# Patient Record
Sex: Female | Born: 1995 | Smoking: Never smoker
Health system: Southern US, Community
[De-identification: ages and names within clinical notes are randomized; demographics above are authoritative.]

## PROBLEM LIST (undated history)

## (undated) DIAGNOSIS — F603 Borderline personality disorder: Secondary | ICD-10-CM

## (undated) DIAGNOSIS — F3181 Bipolar II disorder: Secondary | ICD-10-CM

## (undated) DIAGNOSIS — J45909 Unspecified asthma, uncomplicated: Secondary | ICD-10-CM

## (undated) HISTORY — PX: EYE SURGERY: SHX253

---

## 2015-01-04 ENCOUNTER — Emergency Department (HOSPITAL_COMMUNITY)
Admission: EM | Admit: 2015-01-04 | Discharge: 2015-01-04 | Disposition: A | Discharge status: Home / Self Care | Payer: Self-pay | Attending: Emergency Medicine | Admitting: Emergency Medicine

## 2015-01-04 ENCOUNTER — Encounter (HOSPITAL_COMMUNITY): Payer: Self-pay

## 2015-01-04 DIAGNOSIS — Z8659 Personal history of other mental and behavioral disorders: Secondary | ICD-10-CM | POA: Insufficient documentation

## 2015-01-04 DIAGNOSIS — IMO0001 Reserved for inherently not codable concepts without codable children: Secondary | ICD-10-CM

## 2015-01-04 DIAGNOSIS — J45909 Unspecified asthma, uncomplicated: Secondary | ICD-10-CM | POA: Insufficient documentation

## 2015-01-04 DIAGNOSIS — K219 Gastro-esophageal reflux disease without esophagitis: Secondary | ICD-10-CM | POA: Insufficient documentation

## 2015-01-04 HISTORY — DX: Bipolar II disorder: F31.81

## 2015-01-04 HISTORY — DX: Borderline personality disorder: F60.3

## 2015-01-04 HISTORY — DX: Unspecified asthma, uncomplicated: J45.909

## 2015-01-04 MED ORDER — PANTOPRAZOLE SODIUM 40 MG PO TBEC: 40.0000 mg | DELAYED_RELEASE_TABLET | Freq: Every day | ORAL | Status: AC

## 2015-01-04 NOTE — ED Notes (Signed)
Per EMS, pt. had chest pain  upon inspiration today at 1245. Pt. States that this had happened intermittently for the past 2-3 years The pain was relieved by standing. Hx of Asthma, Bipolar, and borderline personality.

## 2015-01-04 NOTE — Discharge Instructions (Signed)
Medications: Protonix, usual home medications When you have another episode of reflux pain, consider what you have eaten recently. Try to notice any trends or triggers for these symptoms.  Follow Up: Please followup with your primary doctor within 5 days for discussion of your diagnoses and further evaluation after today's visit; if you do not have a primary care doctor use the resource guide provided to find one; Please return to the ER for new or worsening symptoms or additional concerns.   Gastroesophageal Reflux Disease, Adult Normally, food travels down the esophagus and stays in the stomach to be digested. If a person has gastroesophageal reflux disease (GERD), food and stomach acid move back up into the esophagus. When this happens, the esophagus becomes sore and swollen (inflamed). Over time, GERD can make small holes (ulcers) in the lining of the esophagus. HOME CARE Diet  Follow a diet as told by your doctor. You may need to avoid foods and drinks such as:  Coffee and tea (with or without caffeine).  Drinks that contain alcohol.  Energy drinks and sports drinks.  Carbonated drinks or sodas.  Chocolate and cocoa.  Peppermint and mint flavorings.  Garlic and onions.  Horseradish.  Spicy and acidic foods, such as peppers, chili powder, curry powder, vinegar, hot sauces, and BBQ sauce.  Citrus fruit juices and citrus fruits, such as oranges, lemons, and limes.  Tomato-based foods, such as red sauce, chili, salsa, and pizza with red sauce.  Fried and fatty foods, such as donuts, french fries, potato chips, and high-fat dressings.  High-fat meats, such as hot dogs, rib eye steak, sausage, ham, and bacon.  High-fat dairy items, such as whole milk, butter, and cream cheese.  Eat small meals often. Avoid eating large meals.  Avoid drinking large amounts of liquid with your meals.  Avoid eating meals during the 2-3 hours before bedtime.  Avoid lying down right after you  eat.  Do not exercise right after you eat. General Instructions  Pay attention to any changes in your symptoms.  Take over-the-counter and prescription medicines only as told by your doctor. Do not take aspirin, ibuprofen, or other NSAIDs unless your doctor says it is okay.  Do not use any tobacco products, including cigarettes, chewing tobacco, and e-cigarettes. If you need help quitting, ask your doctor.  Wear loose clothes. Do not wear anything tight around your waist.  Raise (elevate) the head of your bed about 6 inches (15 cm).  Try to lower your stress. If you need help doing this, ask your doctor.  If you are overweight, lose an amount of weight that is healthy for you. Ask your doctor about a safe weight loss goal.  Keep all follow-up visits as told by your doctor. This is important. GET HELP IF:  You have new symptoms.  You lose weight and you do not know why it is happening.  You have trouble swallowing, or it hurts to swallow.  You have wheezing or a cough that keeps happening.  Your symptoms do not get better with treatment.  You have a hoarse voice. GET HELP RIGHT AWAY IF:  You have pain in your arms, neck, jaw, teeth, or back.  You feel sweaty, dizzy, or light-headed.  You have chest pain or shortness of breath.  You throw up (vomit) and your throw up looks like blood or coffee grounds.  You pass out (faint).  Your poop (stool) is bloody or black.  You cannot swallow, drink, or eat.   This  information is not intended to replace advice given to you by your health care provider. Make sure you discuss any questions you have with your health care provider.   Document Released: 08/07/2007 Document Revised: 11/09/2014 Document Reviewed: 06/15/2014 Elsevier Interactive Patient Education 2016 [employer]   Emergency Department Resource Guide 1) Find a Doctor and Pay Out of Pocket Although you won't have to find out who is covered by your insurance  plan, it is a good idea to ask around and get recommendations. You will then need to call the office and see if the doctor you have chosen will accept you as a new patient and what types of options they offer for patients who are self-pay. Some doctors offer discounts or will set up payment plans for their patients who do not have insurance, but you will need to ask so you aren't surprised when you get to your appointment.  2) Contact Your Local Health Department Not all health departments have doctors that can see patients for sick visits, but many do, so it is worth a call to see if yours does. If you don't know where your local health department is, you can check in your phone book. The CDC also has a tool to help you locate your state's health department, and many state websites also have listings of all of their local health departments.  3) Find a Walk-in Clinic If your illness is not likely to be very severe or complicated, you may want to try a walk in clinic. These are popping up all over the country in pharmacies, drugstores, and shopping centers. They're usually staffed by nurse practitioners or physician assistants that have been trained to treat common illnesses and complaints. They're usually fairly quick and inexpensive. However, if you have serious medical issues or chronic medical problems, these are probably not your best option.  No Primary Care Doctor: - Call Health Connect at  [phone] - they can help you locate a primary care doctor that  accepts your insurance, provides certain services, etc. - Physician Referral Service- [phone]  Chronic Pain Problems: Organization         Address  Phone   Notes  [facility]  [phone] Patients need to be referred by their primary care doctor.   Medication Assistance: Organization         Address  Phone   Notes  [facility] [address] [place], [place] [address]  [phone] --Must be a resident of [place] -- Must have NO insurance coverage whatsoever (no Medicaid/ Medicare, etc.) -- The pt. MUST have a primary care doctor that directs their care regularly and follows them in the community   [facility]ist  [phone]   [employer]  [phone]    Agencies that provide inexpensive medical care: Organization         Address  Phone   Notes  [facility]  [phone]   [facility]    [phone]   [facility] [address] [place], [place] [address] [phone]   [facility] [address], [place] [phone]   [facility]anned [facility]    [phone]   [facility]    [phone]   [facility]  [address], [place] Phone:  [phone], Fax:  [phone] Hours of Operation:  9 am - 6  pm, M-F.  Also accepts Medicaid/Medicare and self-pay.  [facility]  [address], [place] Phone: [phone], Fax: [phone]. Hours of Operation:  8:30 am - 5:30 pm, M-F.  Also accepts Medicaid and self-pay.  [facility] [place] [address] [place] Phone: [phone]   [facility] [address] Natasha Bence [place], [place] [phone], Ext. [phone] Mondays & Thursdays: 7-9 AM.  First 15 patients are seen on a first come, first serve basis.    Medicaid-accepting [facility] Providers:  Organization         Address  Phone   Notes  [facility] [address], [place] [phone] Also accepts self-pay patients.  [facility] [address] Laurell Josephs [place], [place]  [phone]   [facility] [address], [place] [phone]   [facility] [address] [place] [phone]   Renaye Rakers [address], [place]   [phone] Only accepts [place] patients after they have their name applied to their card.   Self-Pay (no insurance) in [facility]:  Organization         Address  Phone   Notes  Sickle Cell Patients, [facility] [address] [place], [place] [phone]   [facility] [address] [place], [place] [phone]   [facility] [place]  [address], [place] [phone]   [facility]/Dr. Osei-Bonsu  [address] [place] or [address], [place] [phone] Phone number for both [place] and [place] locations is the same.  [facility] [address], [place] [phone]   [facility] [address] [place] or [address] [phone]   [facility] [address], [place] [phone], phone; [phone], fax Sees patients 1st and 3rd Saturday of every month.  Must not qualify for public or private insurance (i.e. Medicaid, Medicare, [place] Health Choice, Veterans' Benefits)  Household income should be no more than 200% of the poverty level The clinic cannot treat you if you are pregnant or think you are pregnant  Sexually transmitted diseases are not treated at the clinic.    Dental Care: Organization         Address  Phone  Notes  [facility] Department of [facility] [address] [place], [place] [phone] Accepts children up to age 66 who are enrolled in [place] or [place] Health Choice; pregnant women with a Medicaid card; and children who have applied for Medicaid or [place] Health Choice, but were declined, whose parents can pay a reduced fee at time of service.  [facility] Department of [facility]  [address], [place] [phone] Accepts children up to age 24 who are enrolled in [place] or [place] Health  Choice; pregnant women with a Medicaid card; and children who have applied for Medicaid or [place] Health Choice, but were declined, whose parents can pay a reduced fee at time of service.  [facility]  [address] [place], [place] [phone] Patients are seen by appointment only. Walk-ins are not accepted. [facility] will see patients 46 years of age and older.  Monday - Tuesday (8am-5pm) Most Wednesdays (8:30-5pm) $30 per visit, cash only  [facility] Adult Dental Access PROGRAM  [address], [facility] [phone] Patients are seen by appointment only. Walk-ins are not accepted. [facility] will see patients 61 years of age and older. One Wednesday Evening (Monthly: Volunteer Based).  $30 per visit, cash only  [employer]  [phone] for adults; Children under age 52, call Graduate Pediatric Dentistry at [phone]. Children aged 10-14, please call [phone] to request a pediatric application.  Dental services are provided in all areas of dental care including fillings, crowns and bridges, complete and partial dentures, implants, gum treatment, root canals, and extractions. Preventive care is also provided. Treatment is provided to both adults and children. Patients are selected via a lottery and there is often a waiting list.   [facility] [address], [place]  [phone] [email or web address]   [facility] [address] [place], [place] [phone], Ext. [phone] Second and Fourth Thursday of each month, opens at 6:30 AM; Clinic ends at 9 AM.  Patients are seen on a first-come first-served basis, and a limited number are seen during each clinic.   [facility]  [address] Ether Griffins [place], [place] [phone]   Eligibility Requirements You must have lived in [place], [place], or [place] counties for at least the last three months.   You cannot be eligible for state or  federal sponsored [place], including [employer], [place], or [employer].   You generally cannot be eligible for healthcare insurance through your employer.    How to apply: Eligibility screenings are held every Tuesday and Wednesday afternoon from 1:00 pm until 4:00 pm. You do not need an appointment for the interview!  [facility] [address], [place], [place] [phone]   [facility] Health Department  [phone]   [facility]  [phone]   [facility] Health Department  [phone]    Behavioral Health Resources in the Community: Intensive Outpatient Programs Organization         Address  Phone  Notes  [facility] [address] [place], [place] [phone]   [facility] [address], [place], [place] [phone]   [facility] [address], [place], [place]  [phone]   [facility] Health [address]  [place], [place] [phone] or [phone]   Substance Abuse Resources Organization         Address  Phone  Notes  [facility] Drug Services  [phone]   [facility]  [phone]   [facility] [place]  [phone]   Floydene Flock  [phone]   Residential & Outpatient Substance Abuse Program  [phone]   Psychological Services Organization         Address  Phone  Notes  [facility] Health  [phone]   [facility] Services  [phone]   [facility] Mental Health [address] [place] [phone] or [phone]    Mobile Crisis Teams Organization         Address  Phone  Notes  [facility], Mobile Crisis Care Unit  [phone]   [facility]  [address] [place], [place] [phone]   Doristine Locks [address] [place] [phone]    Self-Help/Support Groups Organization         Address  Phone  Notes  [facility] [place] - variety of support groups  [phone]- I7437963 Call for more information  Narcotics Anonymous (NA), [facility] [address], [employer] [place]  2 meetings at this location   Statistician         Address  Phone  Notes  [facility] [address],    [place]  [phone]   [facility]  [address], [place] [address], [place], [place] [phone]   [facility] [address] [place], [place] [phone] Admissions: 8am-3pm M-F  [facility] [address]    [place], [place] [phone]   [facility] [address] [place], [place], [place] [phone]   [facility] [address]  [place], [place] [phone]   [facility] - Intensive Outpatient [address] Laurell Josephs [address], [place], [place] [phone]   [facility] ([facility]iction [facility] [address]  [place], [place] [phone] or [phone]   [facility] ([facility]) [address], [place], [place] [phone] Accepts Medicaid  [place] [address]  [place] [phone] Substance Abuse/Addiction Treatment   [facility] Organization         Address  Phone  Notes  [facility]  [phone]   Angie Fava, PhD [address] Ervin Knack [place], [place]   [phone] or [phone]   [facility]   [address] [place], [place] [phone]   [facility] [address], [place], [place] [phone] Insurance/Medicaid/sponsorship through [facility] and Families [address]                                    [place], [place] [phone] Therapy/tele-psych/case  [facility] [address], [place] [phone]    Dr. Lolly Mustache  [phone]   [facility]  [employer] [facility]  Dept. 1) [address] [place] 2) [address] [place] 3)  [address], [place] [phone]  [phone]   [facility] Child Abuse Hotline 9365206094 or [phone] (After Hours)

## 2015-01-04 NOTE — ED Provider Notes (Signed)
CSN: [id number]     Arrival date & time 01/04/15  1353 History   First MD Initiated Contact with Patient 01/04/15 1406     Chief Complaint  Patient presents with  . Chest Pain     (Consider location/radiation/quality/duration/timing/severity/associated sxs/prior Treatment) Patient is a 19 y.o. female presenting with chest pain. The history is provided by the patient and medical records. No language interpreter was used.  Chest Pain Associated symptoms: no abdominal pain, no back pain, no cough, no dizziness, no headache, no nausea, no shortness of breath, not vomiting and no weakness    Stann Mainlandylah Defreitas is a 19 y.o. female  with a PMH of anxiety, bipolar, asthma presents to the Emergency Department complaining of intermittent central chest pain x 2-3 years. Episodes last anywhere between 2 minutes and 30 minutes, which this episode lasting 30 minutes. It has now resolved and patient has no complaints at this time. No associated symptoms. No radiation of the pain. Worse with deep inspirations, no alleviating factors noted.   Past Medical History  Diagnosis Date  . Asthma   . Bipolar 2 disorder (HCC)   . Borderline personality disorder    Past Surgical History  Procedure Laterality Date  . Eye surgery     No family history on file. Social History  Substance Use Topics  . Smoking status: Never Smoker   . Smokeless tobacco: Not on file  . Alcohol Use: Not on file   OB History    No data available     Review of Systems  Constitutional: Negative.   HENT: Negative for congestion, rhinorrhea and sore throat.   Eyes: Negative for visual disturbance.  Respiratory: Negative for cough, shortness of breath and wheezing.   Cardiovascular: Positive for chest pain.  Gastrointestinal: Negative for nausea, vomiting, abdominal pain, diarrhea and constipation.  Endocrine: Negative for polydipsia and polyuria.  Musculoskeletal: Negative for myalgias, back pain, arthralgias and neck pain.  Skin:  Negative for rash.  Neurological: Negative for dizziness, weakness and headaches.      Allergies  Review of patient's allergies indicates not on file.  Home Medications   Prior to Admission medications   Medication Sig Start Date End Date Taking? Authorizing Provider  pantoprazole (PROTONIX) 40 MG tablet Take 1 tablet (40 mg total) by mouth daily. 01/04/15   Joe Gee Pilcher Marlea Gambill, PA-C   BP 115/63 mmHg  Pulse 84  Temp(Src) 99 F (37.2 C)  Resp 16  Ht 5\' 5"  (1.651 m)  Wt 160 lb (72.576 kg)  BMI 26.63 kg/m2  SpO2 99%  LMP 12/14/2014 Physical Exam  Constitutional: She is oriented to person, place, and time. She appears well-developed and well-nourished.  Alert and in no acute distress  HENT:  Head: Normocephalic and atraumatic.  Cardiovascular: Normal rate, regular rhythm, normal heart sounds and intact distal pulses.  Exam reveals no gallop and no friction rub.   No murmur heard. Pulmonary/Chest: Effort normal and breath sounds normal. No respiratory distress. She has no wheezes. She has no rales. She exhibits no tenderness.  Abdominal: She exhibits no mass. There is no rebound and no guarding.  Abdomen soft, non-distended Bowel sounds positive in all four quadrants  Tender to palpation of epigastrium    Musculoskeletal: She exhibits no edema.  Neurological: She is alert and oriented to person, place, and time.  Skin: Skin is warm and dry. No rash noted.  Psychiatric: She has a normal mood and affect. Her behavior is normal. Judgment and thought content normal.  Nursing note and vitals reviewed.   ED Course  Procedures (including critical care time) Labs Review Labs Reviewed - No data to display  Imaging Review No results found. I have personally reviewed and evaluated these images and lab results as part of my medical decision-making.   EKG Interpretation None      MDM   Final diagnoses:  Reflux   Marjarie Irion presents for chest pain x 30 minutes which is  now resolved. She has a normal cardiopulmonary exam, EKG is normal, no significant cardiac risk factors with Heart score of 0. Cardiac/pulmonary etiology unlikely.  Tenderness to palpation of epigastric, along with her history of eating a large amount of Halloween candy over the last 2 days, suggest GERD. Other differentials include anxiety - she states she has been diagnosed with this in the past. Does not state an inciting event or feeling anxious, so not quite as likely, but should be considered. Informed patient to follow up with PCP in the next 5 days to discuss diagnosis with them. She expressed verbal understanding of plan and agreed.      [facility] Ocia Simek, PA-C 01/04/15 1442  Arby Barrette, MD 01/04/15 [phone]

## 2015-05-22 ENCOUNTER — Emergency Department (HOSPITAL_COMMUNITY)
Admission: EM | Admit: 2015-05-22 | Discharge: 2015-05-23 | Disposition: A | Discharge status: Home / Self Care | Payer: BLUE CROSS/BLUE SHIELD | Attending: Emergency Medicine | Admitting: Emergency Medicine

## 2015-05-22 ENCOUNTER — Encounter (HOSPITAL_COMMUNITY): Payer: Self-pay | Admitting: *Deleted

## 2015-05-22 DIAGNOSIS — F603 Borderline personality disorder: Secondary | ICD-10-CM | POA: Insufficient documentation

## 2015-05-22 DIAGNOSIS — Z3202 Encounter for pregnancy test, result negative: Secondary | ICD-10-CM | POA: Insufficient documentation

## 2015-05-22 DIAGNOSIS — J45909 Unspecified asthma, uncomplicated: Secondary | ICD-10-CM | POA: Diagnosis not present

## 2015-05-22 DIAGNOSIS — F3181 Bipolar II disorder: Secondary | ICD-10-CM | POA: Insufficient documentation

## 2015-05-22 DIAGNOSIS — T43594A Poisoning by other antipsychotics and neuroleptics, undetermined, initial encounter: Secondary | ICD-10-CM | POA: Insufficient documentation

## 2015-05-22 DIAGNOSIS — Z79899 Other long term (current) drug therapy: Secondary | ICD-10-CM | POA: Insufficient documentation

## 2015-05-22 DIAGNOSIS — Y9289 Other specified places as the place of occurrence of the external cause: Secondary | ICD-10-CM | POA: Diagnosis not present

## 2015-05-22 DIAGNOSIS — Y9389 Activity, other specified: Secondary | ICD-10-CM | POA: Insufficient documentation

## 2015-05-22 DIAGNOSIS — Y998 Other external cause status: Secondary | ICD-10-CM | POA: Insufficient documentation

## 2015-05-22 DIAGNOSIS — T50904A Poisoning by unspecified drugs, medicaments and biological substances, undetermined, initial encounter: Secondary | ICD-10-CM

## 2015-05-22 LAB — COMPREHENSIVE METABOLIC PANEL
ALK PHOS: 61 U/L (ref 38–126)
ALT: 16 U/L (ref 14–54)
AST: 24 U/L (ref 15–41)
Albumin: 5.1 g/dL — ABNORMAL HIGH (ref 3.5–5.0)
Anion gap: 9 (ref 5–15)
BILIRUBIN TOTAL: 0.8 mg/dL (ref 0.3–1.2)
BUN: 14 mg/dL (ref 6–20)
CALCIUM: 9.9 mg/dL (ref 8.9–10.3)
CHLORIDE: 108 mmol/L (ref 101–111)
CO2: 24 mmol/L (ref 22–32)
CREATININE: 0.84 mg/dL (ref 0.44–1.00)
Glucose, Bld: 109 mg/dL — ABNORMAL HIGH (ref 65–99)
Potassium: 3.4 mmol/L — ABNORMAL LOW (ref 3.5–5.1)
Sodium: 141 mmol/L (ref 135–145)
Total Protein: 9.1 g/dL — ABNORMAL HIGH (ref 6.5–8.1)

## 2015-05-22 LAB — RAPID URINE DRUG SCREEN, HOSP PERFORMED
AMPHETAMINES: NOT DETECTED
Barbiturates: NOT DETECTED
Benzodiazepines: NOT DETECTED
Cocaine: NOT DETECTED
OPIATES: NOT DETECTED
TETRAHYDROCANNABINOL: NOT DETECTED

## 2015-05-22 LAB — CBC
HCT: 38.1 % (ref 36.0–46.0)
Hemoglobin: 13 g/dL (ref 12.0–15.0)
MCH: 25.5 pg — AB (ref 26.0–34.0)
MCHC: 34.1 g/dL (ref 30.0–36.0)
MCV: 74.9 fL — ABNORMAL LOW (ref 78.0–100.0)
PLATELETS: 370 10*3/uL (ref 150–400)
RBC: 5.09 MIL/uL (ref 3.87–5.11)
RDW: 13.7 % (ref 11.5–15.5)
WBC: 6.2 10*3/uL (ref 4.0–10.5)

## 2015-05-22 LAB — I-STAT BETA HCG BLOOD, ED (MC, WL, AP ONLY)

## 2015-05-22 LAB — ETHANOL

## 2015-05-22 LAB — CBG MONITORING, ED: GLUCOSE-CAPILLARY: 88 mg/dL (ref 65–99)

## 2015-05-22 LAB — SALICYLATE LEVEL

## 2015-05-22 LAB — ACETAMINOPHEN LEVEL: Acetaminophen (Tylenol), Serum: <10 ug/mL — ABNORMAL LOW (ref 10–30)

## 2015-05-22 NOTE — ED Notes (Addendum)
Pt states that she was feeling anxious and nevous and wanted to calm down; pt states that she took 120mg  Buspar at 1906; pt c/o feeling nauseous and drowsy; pt states that she vomited x 1; pt denies SI; pt states "I was just trying to calm down"; pt states that she is supposed to take 15mg  of Buspar 3 times a day.

## 2015-05-22 NOTE — ED Notes (Signed)
Spoke with Poison Control - Janice Hernandez and they advised that this is a significant overdose that took some effort; watch for bradycardia, hypotension, and drowsiness; seizures is also a concern, place patient on seizure precautions and treat with benzo's; watch patient 4 hrs post ingestion and recommend pysch eval due to significant overdose

## 2015-05-22 NOTE — ED Notes (Signed)
Nurse Tech drawing labs

## 2015-05-23 NOTE — ED Notes (Signed)
Poison controlled signed off on patient.

## 2015-05-23 NOTE — ED Notes (Signed)
Took patient some water to drink. She has already been drinking her sprite

## 2015-05-23 NOTE — Discharge Instructions (Signed)
Please utilize the resources provided by our counselor. Please utilize resources available at your college.  Return to the ER if there is any thoughts of hurting yourself.

## 2015-05-23 NOTE — BH Assessment (Addendum)
Tele Assessment Note   Janice Hernandez is an 20 y.o. female.  -Clinician was asked by Dr. Luretha Rued to see patient.  Patient took too many of her buspar in an attempt to quell her anxiety.  Patient is accompanied by a roommate and patient's mother.  Patient admits to taking 7 of her buspar in an attempt to calm herself.  Patient said that she has been stressed out about school work and a problem with a roommate.  Patient lives on campus at [place].  Patient says that she has never tried to kill herself and denies those thoughts tonight.  Patient said that she has no HI.  There are times she has seen angels and demons but this is not one of those times.  Mother said that patient has been diagnosed with Bipolar II d/o and Borderline Personality d/o.  Patient denies any current SA issues.  Patient says that she has problems with depression but she does take her medications as directed.  Patient is followed by psychiatrist with the [facility] in [place].  Patient says she looked into the counseling service at [facility] and that she was told they could only see her once monthly.  Patient says she wants to find a provider for counseling that can see her more often.  -Clinician had been told by Dr. Vassie Moselle that patient could go home.  Patient was given referral information and said that she would follow up during the day today.  Patient was discharged home w/ mother and roommate.  Diagnosis: Bipolar 2 d/o; BPD  Past Medical History:  Past Medical History  Diagnosis Date  . Asthma   . Bipolar 2 disorder (HCC)   . Borderline personality disorder     Past Surgical History  Procedure Laterality Date  . Eye surgery      Family History: No family history on file.  Social History:  reports that she has never smoked. She does not have any smokeless tobacco history on file. She reports that she does not drink alcohol or use illicit drugs.  Additional Social History:  Alcohol / Drug Use Pain  Medications: None Prescriptions: Buspar, Lamictal, Latuda, Hydroxine, Omprazoline Over the Counter: None History of alcohol / drug use?: No history of alcohol / drug abuse  CIWA: CIWA-Ar BP: 116/73 mmHg Pulse Rate: 91 COWS:    PATIENT STRENGTHS: (choose at least two) Ability for insight Average or above average intelligence Communication skills Supportive family/friends  Allergies: No Known Allergies  Home Medications:  (Not in a hospital admission)  OB/GYN Status:  Patient's last menstrual period was 05/01/2015.  General Assessment Data [facility] of Assessment: [facility] TTS Assessment: In system Is this a Tele or Face-to-Face Assessment?: Face-to-Face Is this an Initial Assessment or a Re-assessment for this encounter?: Initial Assessment Marital status: Single Is patient pregnant?: No Pregnancy Status: No Living Arrangements: Other (Comment) ([facility]) Can pt return to current living arrangement?: Yes Admission Status: Voluntary Is patient capable of signing voluntary admission?: Yes Referral Source: Self/Family/Friend Insurance type: Administrator, arts     Crisis Care Plan Living Arrangements: Other (Comment) ([facility]) Name of Psychiatrist: Psychiatrist in [facility]) Name of Therapist: None  Education Status Is patient currently in school?: Yes Current Grade: Sophomore 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 :  No Suicidal Intent: No Has patient had any suicidal intent within the past 6 months prior to admission? : No Is patient at risk for suicide?: No Suicidal Plan?: No Has patient had any suicidal plan within the past 6 months prior to admission? : No Access to Means: No What has been your use of drugs/alcohol within the last 12  months?: Pt denies Previous Attempts/Gestures: No How many times?: 0 Other Self Harm Risks: None Triggers for Past Attempts: None known Intentional Self Injurious Behavior: None Family Suicide History: No Recent stressful life event(s): Conflict (Comment), Other (Comment) (Worry over school work.  Worry about roommate) Persecutory voices/beliefs?: Yes Depression: Yes Depression Symptoms: Despondent, Loss of interest in usual pleasures, Feeling worthless/self pity, Isolating, Guilt Substance abuse history and/or treatment for substance abuse?: No Suicide prevention information given to non-admitted patients: Not applicable  Risk to Others within the past 6 months Homicidal Ideation: No Does patient have any lifetime risk of violence toward others beyond the six months prior to admission? : No Thoughts of Harm to Others: No Current Homicidal Intent: No Current Homicidal Plan: No Access to Homicidal Means: No Identified Victim: No one History of harm to others?: No Assessment of Violence: None Noted Violent Behavior Description: None noted Does patient have access to weapons?: No Criminal Charges Pending?: No Does patient have a court date: No Is patient on probation?: No  Psychosis Hallucinations: Auditory, Visual (Has sometimes seen things and heard voices) Delusions: None noted  Mental Status Report Appearance/Hygiene: Unremarkable, In hospital gown Eye Contact: Good Motor Activity: Freedom of movement, Unremarkable Speech: Logical/coherent Level of Consciousness: Alert Mood: Depressed, Anxious, Despair Affect: Anxious Anxiety Level: Severe Thought Processes: Coherent, Relevant Judgement: Unimpaired Orientation: Person, Place, Time, Situation Obsessive Compulsive Thoughts/Behaviors: Minimal  Cognitive Functioning Concentration: Decreased Memory: Recent Intact, Remote Intact IQ: Average Insight: Good Impulse Control: Fair Appetite: Good Weight Loss: 0 Weight  Gain: 0 Sleep: No Change Total Hours of Sleep: 8 Vegetative Symptoms: None  ADLScreening [facility] Assessment Services) Patient's cognitive ability adequate to safely complete daily activities?: Yes Patient able to express need for assistance with ADLs?: Yes Independently performs ADLs?: Yes (appropriate for developmental age)  Prior Inpatient Therapy Prior Inpatient Therapy: No Prior Therapy Dates: None Prior Therapy Facilty/Provider(s): None Reason for Treatment: None  Prior Outpatient Therapy Prior Outpatient Therapy: Yes Prior Therapy Dates: Current  Prior Therapy Facilty/Provider(s): [facility] in [place] Reason for Treatment: med management Does patient have an ACCT team?: No Does patient have Intensive In-House Services?  : No Does patient have [facility] services? : No Does patient have P4CC services?: No  ADL Screening (condition at time of admission) Patient's cognitive ability adequate to safely complete daily activities?: Yes Is the patient deaf or have difficulty hearing?: No Does the patient have difficulty seeing, even when wearing glasses/contacts?: No Does the patient have difficulty concentrating, remembering, or making decisions?: Yes Patient able to express need for assistance with ADLs?: Yes Does the patient have difficulty dressing or bathing?: No Independently performs ADLs?: Yes (appropriate for developmental age) Does the patient have difficulty walking or climbing stairs?: No Weakness of Legs: None Weakness of Arms/Hands: None       Abuse/Neglect Assessment (Assessment to be complete while patient is alone) Physical Abuse: Denies Verbal Abuse: Denies Sexual Abuse: Denies Exploitation of patient/patient's resources: Denies Self-Neglect: Denies     Merchant navy officer (For Healthcare) Does patient have an advance directive?: No Would patient like information on creating an advanced directive?: No - patient declined information  Additional  Information 1:1 In Past 12 Months?: No CIRT Risk: No Elopement Risk: No Does patient have medical clearance?: Yes     Disposition:  Disposition Initial Assessment Completed for this Encounter: Yes Disposition of Patient: Other dispositions Other disposition(s): Other (Comment) (Pt needs outpatient counseling.)  Alexandria LodgeHarvey, Keziah Drotar Ray 05/23/2015 3:17 AM

## 2015-05-23 NOTE — ED Provider Notes (Signed)
CSN: [id number]     Arrival date & time 05/22/15  2045 History  By signing my name below, I, Tanda RockersMargaux Venter, attest that this documentation has been prepared under the direction and in the presence of Derwood KaplanAnkit Tanara Turvey, MD. Electronically Signed: Tanda RockersMargaux Venter, ED Scribe. 05/23/2015. 12:51 AM.   Chief Complaint  Patient presents with  . Drug Overdose   The history is provided by the patient. No language interpreter was used.     HPI Comments: Janice Hernandez is a 20 y.o. female with PMHx Bipoler disorder and borderline personality disorder who presents to the Emergency Department for drug overdose that occurred last night around 7 PM (approximately 5.5 hours ago). Pt states she took 7 tablets of Buspar last night due to feeling very anxious and nervous. She reports that she wanted to calm down, prompting her to take the medications. A little while after taking the medications, pt was talking to her friend about how stressed out she was and admittied to taking the Buspar. Friend advised pt that she needed to try to vomit immediately to get the medication out of her system. Pt made herself vomit but reports it was only a small amount of emesis. She then called the behavioral health center and was advised to come to the ED for further evaluation. Pt states this was not an attempt to harm herself or get attention. Pt has never tried to harm herself in the past. She mentions that 20 minutes after taking the medication she felt nauseated and had room spinning dizziness that has since resolved. Pt currently only complains of a mild headache. Denies abdominal pain or any other associated symptoms. LNMP: 3 weeks ago.   Past Medical History  Diagnosis Date  . Asthma   . Bipolar 2 disorder (HCC)   . Borderline personality disorder    Past Surgical History  Procedure Laterality Date  . Eye surgery     No family history on file. Social History  Substance Use Topics  . Smoking status: Never Smoker   .  Smokeless tobacco: None  . Alcohol Use: No   OB History    No data available     Review of Systems  A complete 10 system review of systems was obtained and all systems are negative except as noted in the HPI and PMH.   Allergies  Review of patient's allergies indicates no known allergies.  Home Medications   Prior to Admission medications   Medication Sig Start Date End Date Taking? Authorizing Provider  busPIRone (BUSPAR) 15 MG tablet Take 15 mg by mouth 3 (three) times daily. 03/18/15  Yes Historical Provider, MD  hydrOXYzine (ATARAX/VISTARIL) 25 MG tablet Take 25 mg by mouth at bedtime. 03/18/15  Yes Historical Provider, MD  lamoTRIgine (LAMICTAL) 200 MG tablet Take 200 mg by mouth at bedtime. 03/18/15  Yes Historical Provider, MD  LATUDA 40 MG TABS tablet Take 40 mg by mouth daily. 04/17/15  Yes Historical Provider, MD  pantoprazole (PROTONIX) 40 MG tablet Take 1 tablet (40 mg total) by mouth daily. 01/04/15  Yes Jaime Pilcher Ward, PA-C   BP 116/73 mmHg  Pulse 91  Temp(Src) 98.7 F (37.1 C) (Oral)  Resp 25  Ht 5' 5.75" (1.67 m)  Wt 163 lb (73.936 kg)  BMI 26.51 kg/m2  SpO2 100%  LMP 05/01/2015   Physical Exam  Constitutional: She is oriented to person, place, and time. She appears well-developed and well-nourished. No distress.  HENT:  Head: Normocephalic and atraumatic.  Mouth/Throat:  Mucous membranes are normal.  Eyes: Conjunctivae and EOM are normal.  No vertical nystagmus.   Neck: Neck supple. No tracheal deviation present.  Cardiovascular: Normal rate.   Pulmonary/Chest: Effort normal. No respiratory distress.  Musculoskeletal: Normal range of motion.  Neurological: She is alert and oriented to person, place, and time.  GCS 15.  CN II-XII grossly intact.   Skin: Skin is warm and dry.  Psychiatric: She has a normal mood and affect. Her behavior is normal.  Nursing note and vitals reviewed.   ED Course  Procedures (including critical care time)  DIAGNOSTIC  STUDIES: Oxygen Saturation is 100% on RA, normal by my interpretation.    COORDINATION OF CARE: 12:50 AM-Discussed treatment plan which includes consult with TTS with pt at bedside and pt agreed to plan.   Labs Review Labs Reviewed  COMPREHENSIVE METABOLIC PANEL - Abnormal; Notable for the following:    Potassium 3.4 (*)    Glucose, Bld 109 (*)    Total Protein 9.1 (*)    Albumin 5.1 (*)    All other components within normal limits  ACETAMINOPHEN LEVEL - Abnormal; Notable for the following:    Acetaminophen (Tylenol), Serum <10 (*)    All other components within normal limits  CBC - Abnormal; Notable for the following:    MCV 74.9 (*)    MCH 25.5 (*)    All other components within normal limits  ETHANOL  SALICYLATE LEVEL  URINE RAPID DRUG SCREEN, HOSP PERFORMED  CBG MONITORING, ED  I-STAT BETA HCG BLOOD, ED ([facility], [facility], [facility] ONLY)    Imaging Review No results found. I have personally reviewed and evaluated these images and lab results as part of my medical decision-making.   EKG Interpretation None      MDM   Final diagnoses:  Overdose, undetermined intent, initial encounter    I personally performed the services described in this documentation, which was scribed in my presence. The recorded information has been reviewed and is accurate.  Pt comes in with cc of OD. Pt has hx of bipolar, depression, anxiety. She was getting nervous about her GPA and homework/exams - so she took 7 busipars. Denies SI. Mother at bedside. Pt has no thoughts of hurting self. She is aox3, shows good judgement currently. Understands that the OD is not smart. Medically cleared at 6 hours. BH clears. Will d/c.     Derwood Kaplan, MD 05/23/15 [phone]

## 2017-07-05 ENCOUNTER — Encounter (HOSPITAL_COMMUNITY): Payer: Self-pay

## 2017-07-05 ENCOUNTER — Other Ambulatory Visit: Payer: Self-pay

## 2017-07-05 ENCOUNTER — Emergency Department (HOSPITAL_COMMUNITY)
Admission: EM | Admit: 2017-07-05 | Discharge: 2017-07-05 | Disposition: A | Discharge status: Home / Self Care | Payer: BLUE CROSS/BLUE SHIELD | Attending: Emergency Medicine | Admitting: Emergency Medicine

## 2017-07-05 ENCOUNTER — Emergency Department (HOSPITAL_COMMUNITY): Payer: BLUE CROSS/BLUE SHIELD

## 2017-07-05 DIAGNOSIS — J45909 Unspecified asthma, uncomplicated: Secondary | ICD-10-CM | POA: Insufficient documentation

## 2017-07-05 DIAGNOSIS — Y929 Unspecified place or not applicable: Secondary | ICD-10-CM | POA: Insufficient documentation

## 2017-07-05 DIAGNOSIS — S99911A Unspecified injury of right ankle, initial encounter: Secondary | ICD-10-CM | POA: Diagnosis present

## 2017-07-05 DIAGNOSIS — Y999 Unspecified external cause status: Secondary | ICD-10-CM | POA: Insufficient documentation

## 2017-07-05 DIAGNOSIS — Z79899 Other long term (current) drug therapy: Secondary | ICD-10-CM | POA: Insufficient documentation

## 2017-07-05 DIAGNOSIS — S93401A Sprain of unspecified ligament of right ankle, initial encounter: Secondary | ICD-10-CM | POA: Insufficient documentation

## 2017-07-05 DIAGNOSIS — Y9301 Activity, walking, marching and hiking: Secondary | ICD-10-CM | POA: Diagnosis not present

## 2017-07-05 DIAGNOSIS — W109XXA Fall (on) (from) unspecified stairs and steps, initial encounter: Secondary | ICD-10-CM | POA: Insufficient documentation

## 2017-07-05 MED ORDER — DICLOFENAC SODIUM 50 MG PO TBEC: 50.0000 mg | DELAYED_RELEASE_TABLET | Freq: Two times a day (BID) | ORAL | 0 refills | Status: AC

## 2017-07-05 MED ORDER — IBUPROFEN 200 MG PO TABS
600.0000 mg | ORAL_TABLET | Freq: Once | ORAL | Status: AC
  Administered 2017-07-05: 600 mg via ORAL
  Filled 2017-07-05: qty 3

## 2017-07-05 NOTE — ED Provider Notes (Signed)
[facility]-EMERGENCY DEPT Provider Note   CSN: [id number] Arrival date & time: 07/05/17  1751     History   Chief Complaint Chief Complaint  Patient presents with  . Ankle Pain    HPI Janice Hernandez is a 22 y.o. female who presents to the ED with ankle pain. The pain is located in the right ankle. Patient reports she was going down steps last night and missed a step. Today she has pain and swelling of the ankle.   HPI  Past Medical History:  Diagnosis Date  . Asthma   . Bipolar 2 disorder (HCC)   . Borderline personality disorder (HCC)     There are no active problems to display for this patient.   Past Surgical History:  Procedure Laterality Date  . EYE SURGERY       OB History   None      Home Medications    Prior to Admission medications   Medication Sig Start Date End Date Taking? Authorizing Provider  busPIRone (BUSPAR) 15 MG tablet Take 15 mg by mouth 3 (three) times daily. 03/18/15   [provider]  diclofenac (VOLTAREN) 50 MG EC tablet Take 1 tablet (50 mg total) by mouth 2 (two) times daily. 07/05/17   Janne Napoleon, NP  hydrOXYzine (ATARAX/VISTARIL) 25 MG tablet Take 25 mg by mouth at bedtime. 03/18/15   [provider]  lamoTRIgine (LAMICTAL) 200 MG tablet Take 200 mg by mouth at bedtime. 03/18/15   [provider]  LATUDA 40 MG TABS tablet Take 40 mg by mouth daily. 04/17/15   [provider]  pantoprazole (PROTONIX) 40 MG tablet Take 1 tablet (40 mg total) by mouth daily. 01/04/15   Ward, Chase Picket, PA-C    Family History History reviewed. No pertinent family history.  Social History Social History   Tobacco Use  . Smoking status: Never Smoker  . Smokeless tobacco: Never Used  Substance Use Topics  . Alcohol use: No  . Drug use: No     Allergies   Patient has no known allergies.   Review of Systems Review of Systems  Musculoskeletal: Positive for arthralgias.       Right ankle  pain  All other systems reviewed and are negative.    Physical Exam Updated Vital Signs BP 125/78 (BP Location: Right Arm)   Pulse 96   Temp 98.7 F (37.1 C) (Oral)   Resp 20   Ht  (1.676 m)   Wt 79.4 kg (175 lb)   LMP 06/23/2017 (Approximate)   SpO2 99%   BMI 28.25 kg/m   Physical Exam  Constitutional: She appears well-developed and well-nourished. No distress.  HENT:  Head: Normocephalic and atraumatic.  Eyes: EOM are normal.  Neck: Neck supple.  Cardiovascular: Normal rate.  Pulmonary/Chest: Effort normal.  Musculoskeletal:       Right ankle: She exhibits swelling. She exhibits no ecchymosis, no deformity, no laceration and normal pulse. Tenderness. Medial malleolus tenderness found. Achilles tendon normal.  Pedal pulse 2+  Neurological: She is alert.  Skin: Skin is warm and dry.  Psychiatric: She has a normal mood and affect.  Nursing note and vitals reviewed.    ED Treatments / Results  Labs (all labs ordered are listed, but only abnormal results are displayed) Labs Reviewed - No data to display Radiology Dg Ankle Complete Right  Result Date: 07/05/2017 CLINICAL DATA:  Lateral ankle pain after fall last night. EXAM: RIGHT ANKLE - COMPLETE 3+  VIEW COMPARISON:  None. FINDINGS: No acute fracture or dislocation. The ankle mortise is symmetric. The talar dome is intact. No tibiotalar joint effusion. Joint spaces are preserved. Bone mineralization is normal. Soft tissues are unremarkable. IMPRESSION: Negative. Electronically Signed   By: Obie Dredge M.D.   On: 07/05/2017 18:47    Procedures Procedures (including critical care time)  Medications Ordered in ED Medications  ibuprofen (ADVIL,MOTRIN) tablet 600 mg (600 mg Oral Given 07/05/17 1842)     Initial Impression / Assessment and Plan / ED Course  I have reviewed the triage vital signs and the nursing notes. 22 y.o. female with right ankle pain and swelling s/p injury yesterday stable for d/c without  fracture or dislocation noted on x-rays and no focal neuro deficits. ASO, crutches, ice, elevation and f/u with PCP. Return precautions discussed.  Final Clinical Impressions(s) / ED Diagnoses   Final diagnoses:  Sprain of right ankle, unspecified ligament, initial encounter    ED Discharge Orders        Ordered    diclofenac (VOLTAREN) 50 MG EC tablet  2 times daily     07/05/17 1859       Kerrie Buffalo Moccasin, NP 07/05/17 4098    Vanetta Mulders, MD 07/07/17 0010

## 2017-07-05 NOTE — ED Triage Notes (Addendum)
Pt reports she was walking down stairs last night, missed the last two stairs, and rolled ankle. Denies hearing pop, snap, etc. Pt states pain has increased today and is more swollen. Pt ambulatory in triage.

## 2020-06-20 ENCOUNTER — Ambulatory Visit: Payer: Self-pay | Admitting: *Deleted

## 2020-06-20 NOTE — Telephone Encounter (Signed)
C/o pain in right lateral thigh after giving injection of testosterone on Monday. Area on right thigh is firm to touch. Denies redness, swelling, warm to touch of drainage. Denies chest pain, difficulty breathing or fever. Encouraged patient to go to [facility] or ED if no relief of pain after taking pain medication. encouraged patient to contact PCP that prescribed medication and report how area is looking if no relief from OTC pain medication. Care advise given. Patient verbalized understanding of care advise and to call back or go to [facility] or ED if symptoms worsen.   Reason for Disposition . [1] SEVERE pain (e.g., excruciating, unable to do any normal activities) AND [2] not improved after 2 hours of pain medicine  Answer Assessment - Initial Assessment Questions 1. ONSET: "When did the pain start?"      After injection of testosterone on Monday  2. LOCATION: "Delana Meyer is the pain located?"      Right lateral thigh 3. PAIN: "How bad is the pain?"    (Scale 1-10; or mild, moderate, severe)   -  MILD (1-3): doesn't interfere with normal activities    -  MODERATE (4-7): interferes with normal activities (e.g., work or school) or awakens from sleep, limping    -  SEVERE (8-10): excruciating pain, unable to do any normal activities, unable to walk     Moderate  4. WORK OR EXERCISE: "Has there been any recent work or exercise that involved this part of the body?"      No . Gave prescribed injection of testosterone in right thigh  5. CAUSE: "What do you think is causing the leg pain?"     After giving injection on medication 6. OTHER SYMPTOMS: "Do you have any other symptoms?" (e.g., chest pain, back pain, breathing difficulty, swelling, rash, fever, numbness, weakness)     Right lateral thigh firm to touch  7. PREGNANCY: "Is there any chance you are pregnant?" "When was your last menstrual period?"     na  Protocols used: LEG PAIN-A-AH

## 2020-11-09 ENCOUNTER — Ambulatory Visit: Payer: Self-pay | Admitting: *Deleted

## 2020-11-09 NOTE — Telephone Encounter (Signed)
Injection site reaction  Reason for Disposition  [1] Caller has URGENT medicine question about med that PCP or specialist prescribed AND [2] triager unable to answer question  Answer Assessment - Initial Assessment Questions 1. NAME of MEDICATION: "What medicine are you calling about?"     Testosterone injection 2. QUESTION: "What is your question?" (e.g., double dose of medicine, side effect)     Patient is experiencing raised redness, painful area at injection site 3. PRESCRIBING HCP: "Who prescribed it?" Reason: if prescribed by specialist, call should be referred to that group.     [facility]  4. SYMPTOMS: "Do you have any symptoms?"     Patient states he normally gives themself sub-Q testosterone injections. Patient states he noticed his needle was larger than the small sub Q needle he normally uses. Patient gave themselves injection in the abdomen 2 days ago. Now having raised red and painful area. Advised patient call specialist for follow up- or UC- may have secondary site infection.  Protocols used: Medication Question Call-A-AH

## 2020-12-31 ENCOUNTER — Encounter (HOSPITAL_COMMUNITY): Payer: Self-pay

## 2020-12-31 ENCOUNTER — Emergency Department (HOSPITAL_COMMUNITY)
Admission: EM | Admit: 2020-12-31 | Discharge: 2020-12-31 | Disposition: A | Discharge status: Home / Self Care | Payer: 59 | Attending: Emergency Medicine | Admitting: Emergency Medicine

## 2020-12-31 ENCOUNTER — Other Ambulatory Visit: Payer: Self-pay

## 2020-12-31 ENCOUNTER — Emergency Department (HOSPITAL_COMMUNITY): Payer: 59

## 2020-12-31 DIAGNOSIS — M25531 Pain in right wrist: Secondary | ICD-10-CM | POA: Insufficient documentation

## 2020-12-31 DIAGNOSIS — X509XXA Other and unspecified overexertion or strenuous movements or postures, initial encounter: Secondary | ICD-10-CM | POA: Insufficient documentation

## 2020-12-31 DIAGNOSIS — J45909 Unspecified asthma, uncomplicated: Secondary | ICD-10-CM | POA: Diagnosis not present

## 2020-12-31 NOTE — Discharge Instructions (Signed)
Your x-rays are normal today, suspect likely sprain or strain of the wrist.  You can continue to treat supportively with Velcro wrist brace, ibuprofen 600 mg and Tylenol 1000 mg every 6 hours you can also apply ice.  If symptoms or not improving in the next week please follow-up with your primary care doctor for further evaluation.

## 2020-12-31 NOTE — ED Triage Notes (Signed)
Pt states that they were trying to pick up their friend and injured right wrist. Pain is worse with movement.

## 2020-12-31 NOTE — ED Provider Notes (Signed)
[facility]   CSN: [id number] Arrival date & time: 12/31/20  1651     History Chief Complaint  Patient presents with   Wrist Pain    Janice Hernandez is a 25 y.o. adult.  Janice Hernandez is a 25 y.o. adult who presents for evaluation of right wrist injury.  Patient reports 2 days ago they were trying to lift up with her friend and they felt like the right wrist bent backwards and popped.  Since then they have continued to have pain in the right wrist primarily over the ulnar aspect that is worse with movement.  They have not noticed swelling or deformity to the wrist.  No numbness weakness or tingling in the hand.  They are still able to move the fingers without difficulty.  No pain in the proximal forearm or elbow.  Patient has tried ibuprofen with slight improvement and purchased on over-the-counter fabric wrist brace.  No other aggravating or alleviating factors.  The history is provided by the patient and medical records.      Past Medical History:  Diagnosis Date   Asthma    Bipolar 2 disorder (HCC)    Borderline personality disorder (HCC)     There are no problems to display for this patient.   Past Surgical History:  Procedure Laterality Date   EYE SURGERY       OB History   No obstetric history on file.     History reviewed. No pertinent family history.  Social History   Tobacco Use   Smoking status: Never   Smokeless tobacco: Never  Vaping Use   Vaping Use: Every day  Substance Use Topics   Alcohol use: No   Drug use: No    Home Medications Prior to Admission medications   Medication Sig Start Date End Date Taking? Authorizing Provider  busPIRone (BUSPAR) 15 MG tablet Take 15 mg by mouth 3 (three) times daily. 03/18/15   [provider]  diclofenac (VOLTAREN) 50 MG EC tablet Take 1 tablet (50 mg total) by mouth 2 (two) times daily. 07/05/17   Janne Napoleon, NP  hydrOXYzine (ATARAX/VISTARIL) 25 MG  tablet Take 25 mg by mouth at bedtime. 03/18/15   [provider]  lamoTRIgine (LAMICTAL) 200 MG tablet Take 200 mg by mouth at bedtime. 03/18/15   [provider]  LATUDA 40 MG TABS tablet Take 40 mg by mouth daily. 04/17/15   [provider]  pantoprazole (PROTONIX) 40 MG tablet Take 1 tablet (40 mg total) by mouth daily. 01/04/15   Ward, Chase Picket, PA-C    Allergies    Patient has no known allergies.  Review of Systems   Review of Systems  Constitutional:  Negative for chills and fever.  Musculoskeletal:  Positive for arthralgias. Negative for joint swelling.  Skin:  Negative for color change and wound.  Neurological:  Negative for weakness and numbness.  All other systems reviewed and are negative.  Physical Exam Updated Vital Signs BP 108/72 (BP Location: Right Arm)   Pulse 90   Temp 98.7 F (37.1 C) (Oral)   Resp 16   SpO2 99%   Physical Exam Vitals and nursing note reviewed.  Constitutional:      General: He is not in acute distress.    Appearance: Normal appearance. He is well-developed. He is not ill-appearing or diaphoretic.  HENT:     Head: Normocephalic and atraumatic.  Eyes:     General:  Right eye: No discharge.        Left eye: No discharge.  Pulmonary:     Effort: Pulmonary effort is normal. No respiratory distress.  Musculoskeletal:     Comments: Tenderness over the ulnar aspect of the left wrist without palpable bony deformity or swelling, no overlying skin changes.  Range of motion is intact but pain elicited primarily with pronation and supination as well as eversion and inversion of the wrist.  2+ radial and ulnar pulses.  Normal sensation and strength.  Neurological:     Mental Status: He is alert and oriented to person, place, and time.     Coordination: Coordination normal.  Psychiatric:        Mood and Affect: Mood normal.        Behavior: Behavior normal.    ED Results / Procedures / Treatments   Labs (all  labs ordered are listed, but only abnormal results are displayed) Labs Reviewed - No data to display  EKG None  Radiology DG Wrist Complete Right  Result Date: 12/31/2020 CLINICAL DATA:  Injured right wrist. EXAM: RIGHT WRIST - COMPLETE 3+ VIEW COMPARISON:  None. FINDINGS: The joint spaces are maintained.  No acute fracture. IMPRESSION: No acute bony findings. Electronically Signed   By: Rudie Meyer M.D.   On: 12/31/2020 17:48    Procedures Procedures   Medications Ordered in ED Medications - No data to display  ED Course  I have reviewed the triage vital signs and the nursing notes.  Pertinent labs & imaging results that were available during my care of the patient were reviewed by me and considered in my medical decision making (see chart for details).    MDM Rules/Calculators/A&P                           Patient presents with right wrist pain after lifting up a friend.  Primarily over the ulnar aspect of the wrist, no overlying skin changes or swelling, no concern for septic arthritis.  X-ray without acute bony abnormalities and the wrist is neurovascularly intact.  High clinical suspicion for sprain or strain of the wrist.  Will provide patient with a more supportive Velcro wrist brace and encouraged continued NSAIDs and Tylenol, as well as ice.  PCP follow-up if symptoms not improving in 1 week.  Patient expresses understanding.  Discharged home in good condition.  Final Clinical Impression(s) / ED Diagnoses Final diagnoses:  Right wrist pain    Rx / DC Orders ED Discharge Orders     None        Legrand Rams 01/01/21 0056    Gerhard Munch, MD 01/03/21 1719

## 2020-12-31 NOTE — Progress Notes (Signed)
Orthopedic Tech Progress Note Patient Details:  Janice Hernandez October 29, 1995 [id number]  Ortho Devices Type of Ortho Device: Velcro wrist forearm splint Ortho Device/Splint Location: right Ortho Device/Splint Interventions: Application   Post Interventions Patient Tolerated: Well Instructions Provided: Care of device  Saul Fordyce 12/31/2020, 6:14 PM

## 2021-12-18 ENCOUNTER — Ambulatory Visit
Admission: EM | Admit: 2021-12-18 | Discharge: 2021-12-18 | Disposition: A | Discharge status: Home / Self Care | Payer: BC Managed Care – PPO

## 2021-12-18 ENCOUNTER — Ambulatory Visit (INDEPENDENT_AMBULATORY_CARE_PROVIDER_SITE_OTHER): Payer: BC Managed Care – PPO

## 2021-12-18 DIAGNOSIS — M25531 Pain in right wrist: Secondary | ICD-10-CM

## 2021-12-18 NOTE — Discharge Instructions (Addendum)
The x-ray of your right wrist is normal, there are no acute bony abnormalities or significant soft tissue swelling.  Please continue to wear your wrist brace daily with all activities as well as at nighttime to protect your wrist and prevent further injury.  You are welcome to take ibuprofen 400 to 600 mg every 8 hours as needed for pain.  I also recommend that you apply ice to your wrist whenever it is feeling sore.  If you do not have meaningful improvement of your pain after the next 10 to 14 days, please consider following up with the orthopedic specialist for further evaluation of the tendons in your wrist.  Thank you for visiting urgent care today.

## 2021-12-18 NOTE — ED Triage Notes (Signed)
The patient states there is pain to the right wrist that began Saturday after moving objects at work.   The patient also has pain to right shoulder and states the area does swell at times (started about 3 weeks ago).   Home interventions: ibuprofen

## 2021-12-18 NOTE — ED Provider Notes (Signed)
[facility]    CSN: [id number] Arrival date & time: 12/18/21  1848    HISTORY   Chief Complaint  Patient presents with   Shoulder Pain   Wrist Pain   HPI Janice Hernandez is a pleasant, 26 y.o. adult who presents to urgent care today. 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  Home interventions: ibuprofen.  Patient reports having sprained right wrist last year in October.  The history is provided by the patient.   Past Medical History:  Diagnosis Date   Asthma    Bipolar 2 disorder ([place])    Borderline personality disorder ([place])    There are no problems to display for this patient.  Past Surgical History:  Procedure Laterality Date   EYE SURGERY     OB History   No obstetric history on file.    Home Medications    Prior to Admission medications   Medication Sig Start Date End Date Taking? Authorizing Provider  busPIRone (BUSPAR) 15 MG tablet Take 15 mg by mouth 3 (three) times daily. 03/18/15   [provider]  diclofenac (VOLTAREN) 50 MG EC tablet Take 1 tablet (50 mg total) by mouth 2 (two) times daily. 07/05/17   Ashley Murrain, NP  hydrOXYzine (ATARAX/VISTARIL) 25 MG tablet Take 25 mg by mouth at bedtime. 03/18/15   [provider]  lamoTRIgine (LAMICTAL) 200 MG tablet Take 200 mg by mouth at bedtime. 03/18/15   [provider]  LATUDA 40 MG TABS tablet Take 40 mg by mouth daily. 04/17/15   [provider]  pantoprazole (PROTONIX) 40 MG tablet Take 1 tablet (40 mg total) by mouth daily. 01/04/15   Ward, Ozella Almond, PA-C  QUEtiapine (SEROQUEL) 300 MG tablet Take 300 mg by mouth at bedtime.    [provider]    Family History History reviewed. No pertinent family history. Social History Social History   Tobacco Use   Smoking status: Never   Smokeless tobacco: Never  Vaping Use   Vaping Use: Every day  Substance Use Topics   Alcohol  use: No   Drug use: No   Allergies   Patient has no known allergies.  Review of Systems Review of Systems Pertinent findings revealed after performing a 14 point review of systems has been noted in the history of present illness.  Physical Exam Triage Vital Signs ED Triage Vitals  Enc Vitals Group     BP 12/29/20 0827 (!) 147/82     Pulse Rate 12/29/20 0827 72     Resp 12/29/20 0827 18     Temp 12/29/20 0827 98.3 F (36.8 C)     Temp Source 12/29/20 0827 Oral     SpO2 12/29/20 0827 98 %     Weight --      Height --      Head Circumference --      Peak Flow --      Pain Score 12/29/20 0826 5     Pain Loc --      Pain Edu? --      Excl. in [place]? --    Updated Vital Signs BP 124/81 (BP Location: Left Arm)   Pulse (!) 103   Temp 99.1 F (37.3 C) (Oral)   Resp 16   SpO2 97%   Physical Exam  [facility] / Diagnostics / Procedures:     Radiology No results found.  Procedures Procedures (including critical care time) EKG  Pending results:  Labs Reviewed - No data to display  Medications Ordered in UC: Medications - No data to display  UC Diagnoses / Final Clinical Impressions(s)   I have reviewed the triage vital signs and the nursing notes.  Pertinent labs & imaging results that were available during my care of the patient were reviewed by me and considered in my medical decision making (see chart for details).    Final diagnoses:  Acute pain of right wrist    Patient was provided with an injection during their visit today for acute pain relief.  Patient was advised to:  {LOWERBACKPAINPLAN:26747}  ED Prescriptions   None    PDMP not reviewed this encounter.  Discharge Instructions:   Discharge Instructions      The x-ray of your right wrist is normal, there are no acute bony abnormalities or significant soft tissue swelling.  Please continue to wear your wrist brace daily with all activities as well as at nighttime to protect your wrist and  prevent further injury.  You are welcome to take ibuprofen 400 to 600 mg every 8 hours as needed for pain.  I also recommend that you apply ice to your wrist whenever it is feeling sore.  If you do not have meaningful improvement of your pain after the next 10 to 14 days, please consider following up with the orthopedic specialist for further evaluation of the tendons in your wrist.  Thank you for visiting urgent care today.      Disposition Upon Discharge:  Condition: stable for discharge home Home: take medications as prescribed; routine discharge instructions as discussed; follow up as advised.  Patient presented with an acute illness with associated systemic symptoms and significant discomfort requiring urgent management. In my opinion, this is a condition that a prudent lay person (someone who possesses an average knowledge of health and medicine) may potentially expect to result in complications if not addressed urgently such as respiratory distress, impairment of bodily function or dysfunction of bodily organs.   Routine symptom specific, illness specific and/or disease specific instructions were discussed with the patient and/or caregiver at length.   As such, the patient has been evaluated and assessed, work-up was performed and treatment was provided in alignment with urgent care protocols and evidence based medicine.  Patient/parent/caregiver has been advised that the patient may require follow up for further testing and treatment if the symptoms continue in spite of treatment, as clinically indicated and appropriate.  Patient/parent/caregiver has been advised to report to orthopedic urgent care clinic or return to the [facility] or PCP in 3-5 days if no better; follow-up with orthopedics, PCP or the Emergency Department if new signs and symptoms develop or if the current signs or symptoms continue to change or worsen for further workup, evaluation and treatment as clinically indicated and  appropriate  The patient will follow up with their current PCP if and as advised. If the patient does not currently have a PCP we will have assisted them in obtaining one.   The patient may need specialty follow up if the symptoms continue, in spite of conservative treatment and management, for further workup, evaluation, consultation and treatment as clinically indicated and appropriate.  Patient/parent/caregiver verbalized understanding and agreement of plan as discussed.  All questions were addressed during visit.  Please see discharge instructions below for further details of plan.  This office note has been dictated using Teaching laboratory technician.  Unfortunately, this  method of dictation can sometimes lead to typographical or grammatical errors.  I apologize for your inconvenience in advance if this occurs.  Please do not hesitate to reach out to me if clarification is needed.

## 2022-07-16 IMAGING — CR DG WRIST COMPLETE 3+V*R*
4 series · 4 of 4 positions shown · non-contrast
Comparison: None.

CLINICAL DATA: Injured right wrist.

EXAM:
RIGHT WRIST - COMPLETE 3+ VIEW

[x wrist pa right]
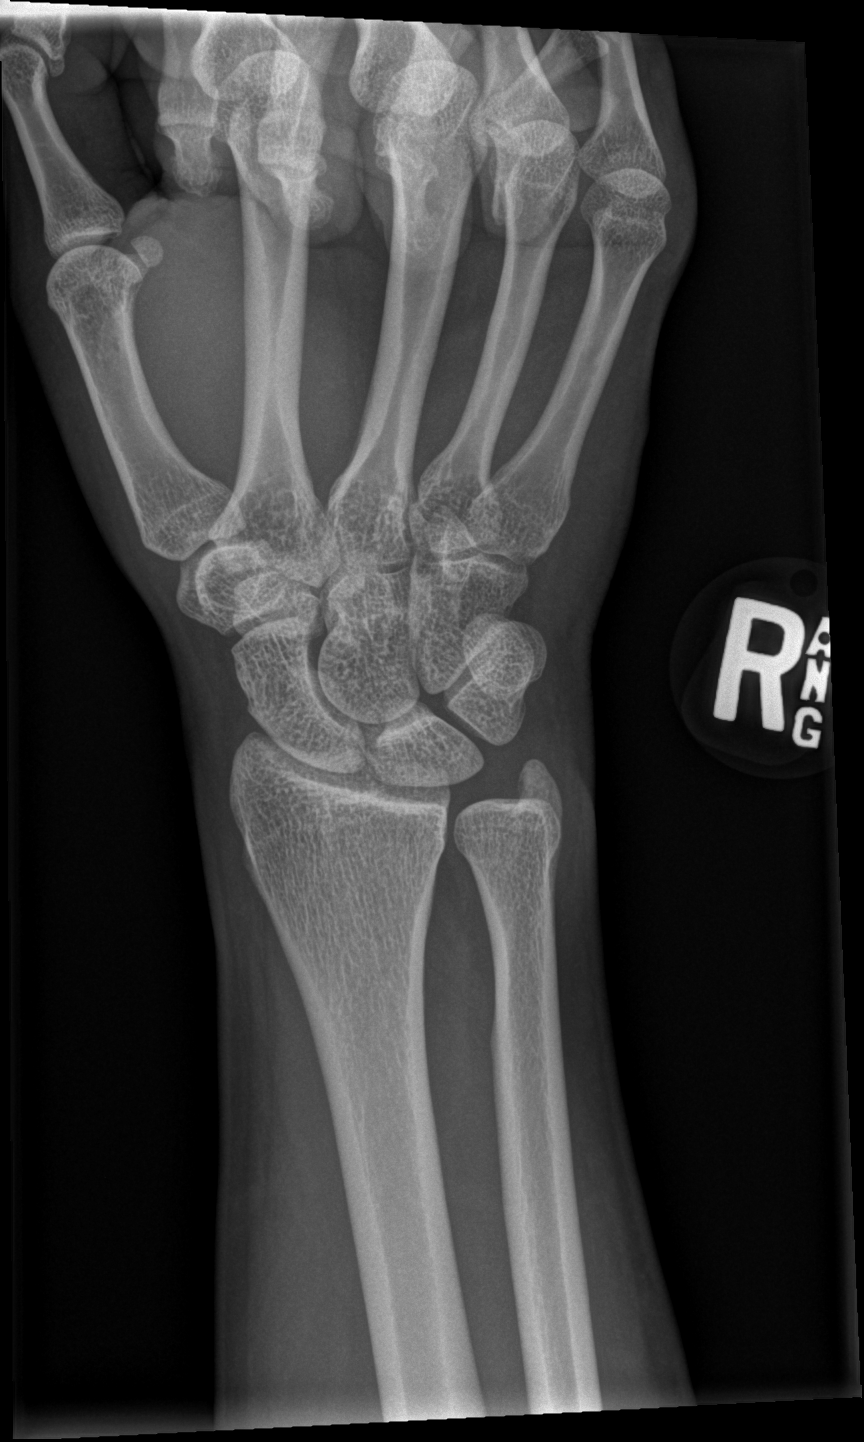

[x wrist obl right]
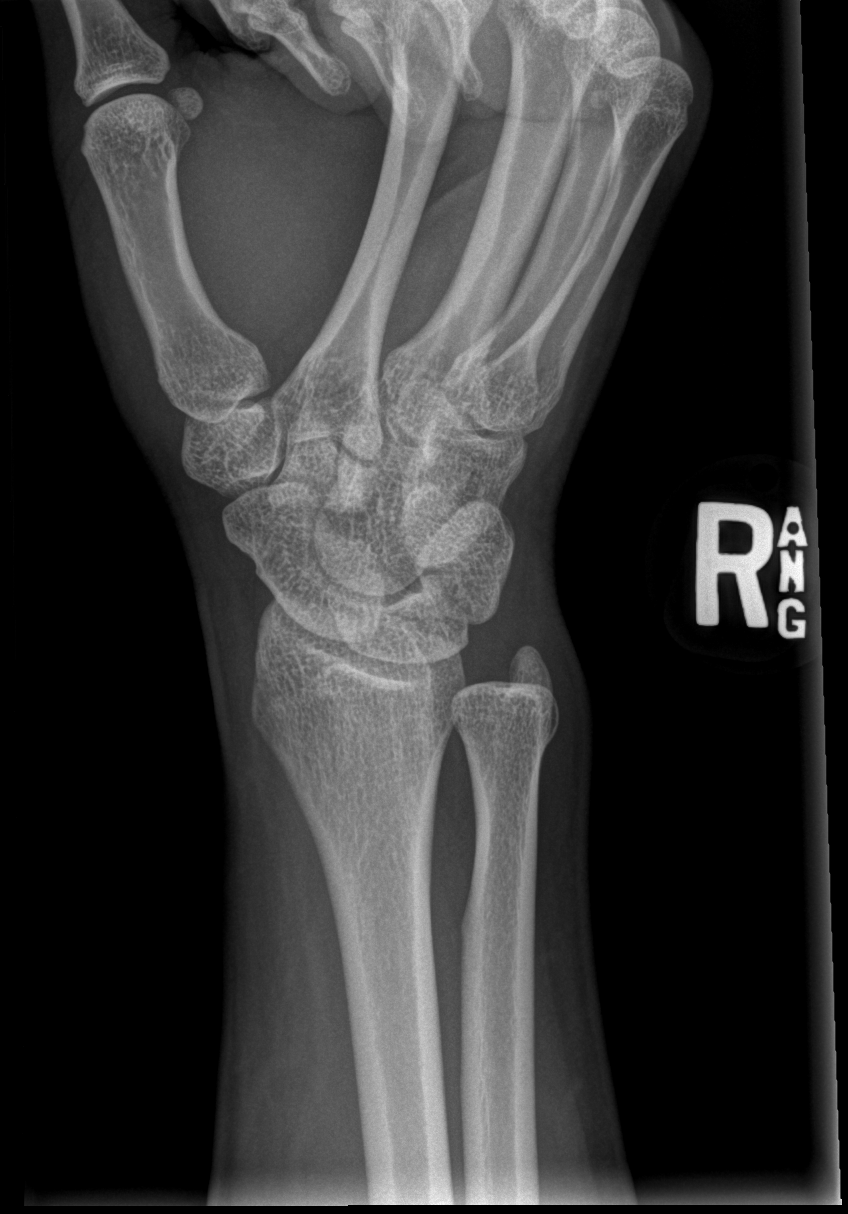

[x wrist lat right]
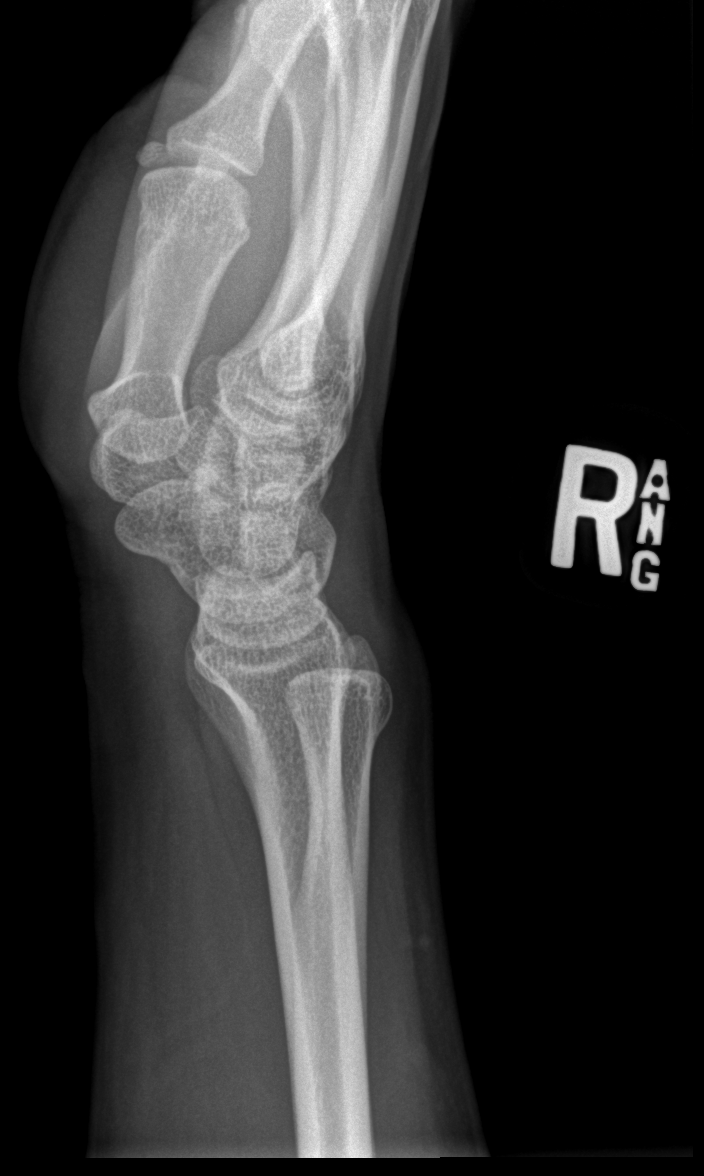

[x wrist navicular view right]
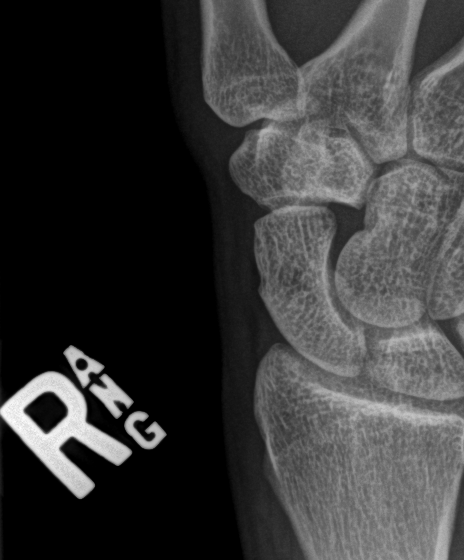

[4 of 4 positions shown; findings below may reference images not displayed]

FINDINGS: The joint spaces are maintained.  No acute fracture.
IMPRESSION: No acute bony findings.
